# Patient Record
Sex: Female | Born: 1955 | Race: White | Hispanic: No | Smoking: Former smoker
Health system: Southern US, Community
[De-identification: ages and names within clinical notes are randomized; demographics above are authoritative.]

## PROBLEM LIST (undated history)

## (undated) DIAGNOSIS — J302 Other seasonal allergic rhinitis: Secondary | ICD-10-CM

## (undated) DIAGNOSIS — F32A Depression, unspecified: Secondary | ICD-10-CM

## (undated) DIAGNOSIS — F329 Major depressive disorder, single episode, unspecified: Secondary | ICD-10-CM

## (undated) DIAGNOSIS — E7800 Pure hypercholesterolemia, unspecified: Secondary | ICD-10-CM

## (undated) DIAGNOSIS — F419 Anxiety disorder, unspecified: Secondary | ICD-10-CM

## (undated) HISTORY — PX: OTHER SURGICAL HISTORY: SHX169

---

## 1999-11-30 ENCOUNTER — Emergency Department (HOSPITAL_COMMUNITY): Admission: EM | Admit: 1999-11-30 | Discharge: 1999-11-30 | Payer: Self-pay | Admitting: Emergency Medicine

## 1999-11-30 ENCOUNTER — Encounter: Payer: Self-pay | Admitting: Emergency Medicine

## 1999-12-02 ENCOUNTER — Emergency Department (HOSPITAL_COMMUNITY): Admission: EM | Admit: 1999-12-02 | Discharge: 1999-12-02 | Payer: Self-pay | Admitting: Emergency Medicine

## 1999-12-10 ENCOUNTER — Emergency Department (HOSPITAL_COMMUNITY): Admission: EM | Admit: 1999-12-10 | Discharge: 1999-12-10 | Payer: Self-pay | Admitting: Emergency Medicine

## 2004-11-28 ENCOUNTER — Ambulatory Visit: Payer: Self-pay | Admitting: Gastroenterology

## 2005-12-23 ENCOUNTER — Ambulatory Visit: Payer: Self-pay | Admitting: Unknown Physician Specialty

## 2005-12-25 ENCOUNTER — Ambulatory Visit: Payer: Self-pay | Admitting: Unknown Physician Specialty

## 2008-07-25 ENCOUNTER — Ambulatory Visit: Payer: Self-pay | Admitting: Unknown Physician Specialty

## 2009-09-24 ENCOUNTER — Ambulatory Visit: Payer: Self-pay | Admitting: Unknown Physician Specialty

## 2011-04-10 ENCOUNTER — Ambulatory Visit: Payer: Self-pay | Admitting: Unknown Physician Specialty

## 2012-06-02 ENCOUNTER — Ambulatory Visit: Payer: Self-pay | Admitting: Physician Assistant

## 2013-08-22 ENCOUNTER — Ambulatory Visit: Payer: Self-pay | Admitting: Physician Assistant

## 2015-01-26 ENCOUNTER — Other Ambulatory Visit: Payer: Self-pay | Admitting: Internal Medicine

## 2015-01-26 DIAGNOSIS — Z1231 Encounter for screening mammogram for malignant neoplasm of breast: Secondary | ICD-10-CM

## 2015-01-30 ENCOUNTER — Ambulatory Visit: Payer: Self-pay

## 2015-02-19 ENCOUNTER — Ambulatory Visit: Payer: Self-pay

## 2015-03-06 ENCOUNTER — Ambulatory Visit
Admission: RE | Admit: 2015-03-06 | Discharge: 2015-03-06 | Disposition: A | Discharge status: Home / Self Care | Payer: 59 | Source: Ambulatory Visit | Attending: Internal Medicine | Admitting: Internal Medicine

## 2015-03-06 DIAGNOSIS — Z1231 Encounter for screening mammogram for malignant neoplasm of breast: Secondary | ICD-10-CM | POA: Diagnosis present

## 2015-04-27 ENCOUNTER — Encounter: Payer: Self-pay | Admitting: *Deleted

## 2015-04-30 ENCOUNTER — Ambulatory Visit: Payer: 59 | Admitting: Anesthesiology

## 2015-04-30 ENCOUNTER — Encounter
Admission: RE | Disposition: A | Discharge status: Home / Self Care | Payer: Self-pay | Source: Ambulatory Visit | Attending: Gastroenterology

## 2015-04-30 ENCOUNTER — Ambulatory Visit
Admission: RE | Admit: 2015-04-30 | Discharge: 2015-04-30 | Disposition: A | Discharge status: Home / Self Care | Payer: 59 | Source: Ambulatory Visit | Attending: Gastroenterology | Admitting: Gastroenterology

## 2015-04-30 ENCOUNTER — Encounter: Payer: Self-pay | Admitting: *Deleted

## 2015-04-30 DIAGNOSIS — F329 Major depressive disorder, single episode, unspecified: Secondary | ICD-10-CM | POA: Diagnosis not present

## 2015-04-30 DIAGNOSIS — E780 Pure hypercholesterolemia: Secondary | ICD-10-CM | POA: Insufficient documentation

## 2015-04-30 DIAGNOSIS — Z87891 Personal history of nicotine dependence: Secondary | ICD-10-CM | POA: Insufficient documentation

## 2015-04-30 DIAGNOSIS — Z91030 Bee allergy status: Secondary | ICD-10-CM | POA: Insufficient documentation

## 2015-04-30 DIAGNOSIS — Z1211 Encounter for screening for malignant neoplasm of colon: Secondary | ICD-10-CM | POA: Insufficient documentation

## 2015-04-30 DIAGNOSIS — F419 Anxiety disorder, unspecified: Secondary | ICD-10-CM | POA: Diagnosis not present

## 2015-04-30 HISTORY — PX: COLONOSCOPY WITH PROPOFOL: SHX5780

## 2015-04-30 HISTORY — DX: Other seasonal allergic rhinitis: J30.2

## 2015-04-30 HISTORY — DX: Major depressive disorder, single episode, unspecified: F32.9

## 2015-04-30 HISTORY — DX: Depression, unspecified: F32.A

## 2015-04-30 HISTORY — DX: Pure hypercholesterolemia, unspecified: E78.00

## 2015-04-30 HISTORY — DX: Anxiety disorder, unspecified: F41.9

## 2015-04-30 SURGERY — COLONOSCOPY WITH PROPOFOL
Anesthesia: General

## 2015-04-30 MED ORDER — PROPOFOL INFUSION 10 MG/ML OPTIME
INTRAVENOUS | Status: DC | PRN
  Administered 2015-04-30: 120 ug/kg/min via INTRAVENOUS

## 2015-04-30 MED ORDER — SODIUM CHLORIDE 0.9 % IV SOLN: INTRAVENOUS | Status: DC

## 2015-04-30 MED ORDER — PROPOFOL 10 MG/ML IV BOLUS
INTRAVENOUS | Status: DC | PRN
  Administered 2015-04-30 (×2): 50 mg via INTRAVENOUS

## 2015-04-30 MED ORDER — SODIUM CHLORIDE 0.9 % IV SOLN
INTRAVENOUS | Status: DC
  Administered 2015-04-30: 08:00:00 via INTRAVENOUS

## 2015-04-30 MED ORDER — LIDOCAINE HCL (CARDIAC) 20 MG/ML IV SOLN
INTRAVENOUS | Status: DC | PRN
  Administered 2015-04-30: 60 mg via INTRAVENOUS

## 2015-04-30 NOTE — Transfer of Care (Signed)
Immediate Anesthesi  a Transfer of Care Note  Patient: Jean Mercado  Procedure(s) Performed: Procedure(s): COLONOSCOPY WITH PROPOFOL (N/A)  Patient Location: PACU  Anesthesia Type:General  Level of Consciousness: awake  Airway & Oxygen Therapy: Patient Spontanous Breathing  Post-op Assessment: Report given to RN and Post -op Vital signs reviewed and stable  Post vital signs: Reviewed and stable  Last Vitals:  Filed Vitals:   04/30/15 0827  BP: 100/53  Pulse: 60  Temp: 36 C  Resp: 17    Complications: No apparent anesthesia complications

## 2015-04-30 NOTE — H&P (Signed)
Primary Care Physician:  Lynnea Ferrier, MD Primary Gastroenterologist:  Dr. Bluford Kaufmann  Pre-Procedure History & Physical: HPI:  Jean Mercado is a 59 y.o. female is here for an colonoscopy.   Past Medical History  Diagnosis Date  . Depression   . Anxiety   . High cholesterol   . Seasonal allergies     Past Surgical History  Procedure Laterality Date  . Dilation and crettage      Prior to Admission medications   Medication Sig Start Date End Date Taking? Authorizing Provider  acetaminophen (TYLENOL) 325 MG tablet Take 325 mg by mouth every 4 (four) hours as needed.   Yes Historical Provider, MD  Calcium Ascorbate 500 MG TABS Take 500 mg by mouth every morning.   Yes Historical Provider, MD  calcium citrate (CALCITRATE - DOSED IN MG ELEMENTAL CALCIUM) 950 MG tablet Take 200 mg of elemental calcium by mouth daily.   Yes Historical Provider, MD  cetirizine (ZYRTEC) 10 MG tablet Take 10 mg by mouth daily.   Yes Historical Provider, MD  EPINEPHrine 0.3 mg/0.3 mL IJ SOAJ injection Inject 0.3 mg into the muscle once.   Yes Historical Provider, MD  FLUoxetine (PROZAC) 20 MG capsule Take 60 mg by mouth daily.   Yes Historical Provider, MD  fluticasone (FLONASE) 50 MCG/ACT nasal spray Place 2 sprays into both nostrils daily.   Yes Historical Provider, MD  Lactobacillus Rhamnosus, GG, ([employer] PROBIOTIC, LACTOBACILLUS, PO) Take 1 capsule by mouth every morning.   Yes Historical Provider, MD  Multiple Vitamin (MULTIVITAMIN WITH MINERALS) TABS tablet Take 1 tablet by mouth daily.   Yes Historical Provider, MD  simvastatin (ZOCOR) 40 MG tablet Take 40 mg by mouth daily.   Yes Historical Provider, MD  vitamin E 400 UNIT capsule Take 400 Units by mouth daily.   Yes Historical Provider, MD    Allergies as of 03/13/2015  . (Not on File)    History reviewed. No pertinent family history.  Social History   Social History  . Marital Status: Married    Spouse Name: N/A  . Number of  Children: N/A  . Years of Education: N/A   Occupational History  . Not on file.   Social History Main Topics  . Smoking status: Former Games developer  . Smokeless tobacco: Never Used  . Alcohol Use: 4.2 oz/week    7 Glasses of wine per week  . Drug Use: No  . Sexual Activity: Not on file   Other Topics Concern  . Not on file   Social History Narrative    Review of Systems: See HPI, otherwise negative ROS  Physical Exam: BP 105/70 mmHg  Pulse 68  Temp(Src) 98.2 F (36.8 C) (Tympanic)  Resp 18  Ht  (1.778 m)  Wt 74.844 kg (165 lb)  BMI 23.68 kg/m2  SpO2 99% General:   Alert,  pleasant and cooperative in NAD Head:  Normocephalic and atraumatic. Neck:  Supple; no masses or thyromegaly. Lungs:  Clear throughout to auscultation.    Heart:  Regular rate and rhythm. Abdomen:  Soft, nontender and nondistended. Normal bowel sounds, without guarding, and without rebound.   Neurologic:  Alert and  oriented x4;  grossly normal neurologically.  Impression/Plan: Jean Mercado is here for an colonoscopy to be performed for screening.  Risks, benefits, limitations, and alternatives regarding colonoscopy have been reviewed with the patient.  Questions have been answered.  All parties agreeable.   OH, Jean Standing, MD  04/30/2015, 7:59 AM

## 2015-04-30 NOTE — Anesthesia Preprocedure Evaluation (Signed)
Anesthesia Evaluation  Patient identified by MRN, date of birth, ID band Patient awake    Reviewed: Allergy & Precautions, H&P , NPO status , Patient's Chart, lab work & pertinent test results, reviewed documented beta blocker date and time   History of Anesthesia Complications Negative for: history of anesthetic complications  Airway Mallampati: II  TM Distance: >3 FB Neck ROM: full    Dental no notable dental hx. (+) Caps   Pulmonary neg pulmonary ROS, former smoker,    Pulmonary exam normal breath sounds clear to auscultation       Cardiovascular Exercise Tolerance: Good negative cardio ROS Normal cardiovascular exam Rhythm:regular Rate:Normal     Neuro/Psych PSYCHIATRIC DISORDERS (depression) negative neurological ROS     GI/Hepatic negative GI ROS, Neg liver ROS,   Endo/Other  negative endocrine ROS  Renal/GU negative Renal ROS  negative genitourinary   Musculoskeletal   Abdominal   Peds  Hematology negative hematology ROS (+)   Anesthesia Other Findings Past Medical History:   Depression                                                   Anxiety                                                      High cholesterol                                             Seasonal allergies                                           Reproductive/Obstetrics negative OB ROS                             Anesthesia Physical Anesthesia Plan  ASA: II  Anesthesia Plan: General   Post-op Pain Management:    Induction:   Airway Management Planned:   Additional Equipment:   Intra-op Plan:   Post-operative Plan:   Informed Consent: I have reviewed the patients History and Physical, chart, labs and discussed the procedure including the risks, benefits and alternatives for the proposed anesthesia with the patient or authorized representative who has indicated his/her understanding and acceptance.    Dental Advisory Given  Plan Discussed with: Anesthesiologist, CRNA and Surgeon  Anesthesia Plan Comments:         Anesthesia Quick Evaluation

## 2015-04-30 NOTE — Op Note (Signed)
[facility] Gastroenterology Patient Name: Jean Mercado Procedure Date: 04/30/2015 7:35 AM MRN: [id number] Account #: [id number] Date of Birth: [birth year] Admit Type: Outpatient Age: 59 Room: [facility] ENDO ROOM 4 Gender: Female Note Status: Finalized Procedure:         Colonoscopy Indications:       Screening for colorectal malignant neoplasm Providers:         Ezzard Standing. Bluford Kaufmann, MD Referring MD:      Sallye Lat Md, MD (Referring MD) Medicines:         Monitored Anesthesia Care Complications:     No immediate complications. Procedure:         Pre-Anesthesia Assessment:                    - Prior to the procedure, a History and Physical was                     performed, and patient medications, allergies and                     sensitivities were reviewed. The patient's tolerance of                     previous anesthesia was reviewed.                    - The risks and benefits of the procedure and the sedation                     options and risks were discussed with the patient. All                     questions were answered and informed consent was obtained.                    - After reviewing the risks and benefits, the patient was                     deemed in satisfactory condition to undergo the procedure.                    After obtaining informed consent, the colonoscope was                     passed under direct vision. Throughout the procedure, the                     patient's blood pressure, pulse, and oxygen saturations                     were monitored continuously. The Colonoscope was                     introduced through the anus and advanced to the the cecum,                     identified by appendiceal orifice and ileocecal valve. The                     colonoscopy was performed without difficulty. The patient                     tolerated the procedure well. The quality of the bowel  preparation was good. Findings:      The  colon (entire examined portion) appeared normal. Impression:        - The entire examined colon is normal.                    - No specimens collected. Recommendation:    - Discharge patient to home.                    - Repeat colonoscopy in 10 years for surveillance.                    - The findings and recommendations were discussed with the                     patient. Procedure Code(s): --- Professional ---                    [phone], Colonoscopy, flexible; diagnostic, including                     collection of specimen(s) by brushing or washing, when                     performed (separate procedure) Diagnosis Code(s): --- Professional ---                    Z12.11, Encounter for screening for malignant neoplasm of                     colon CPT copyright 2014 American Medical Association. All rights reserved. The codes documented in this report are preliminary and upon coder review may  be revised to meet current compliance requirements. Wallace Cullens, MD 04/30/2015 8:22:30 AM This report has been signed electronically. Number of Addenda: 0 Note Initiated On: 04/30/2015 7:35 AM Scope Withdrawal Time: 0 hours 6 minutes 18 seconds  Total Procedure Duration: 0 hours 12 minutes 40 seconds       [facility]

## 2015-05-01 NOTE — Anesthesia Postprocedure Evaluation (Signed)
  Anesthesia Post-op Note  Patient: Jean Mercado  Procedure(s) Performed: Procedure(s): COLONOSCOPY WITH PROPOFOL (N/A)  Anesthesia type:General  Patient location: PACU  Post pain: Pain level controlled  Post assessment: Post-op Vital signs reviewed, Patient's Cardiovascular Status Stable, Respiratory Function Stable, Patent Airway and No signs of Nausea or vomiting  Post vital signs: Reviewed and stable  Last Vitals:  Filed Vitals:   04/30/15 0855  BP: 103/69  Pulse: 78  Temp:   Resp: 25    Level of consciousness: awake, alert  and patient cooperative  Complications: No apparent anesthesia complications

## 2015-05-02 ENCOUNTER — Encounter: Payer: Self-pay | Admitting: Gastroenterology

## 2016-01-28 ENCOUNTER — Other Ambulatory Visit: Payer: Self-pay | Admitting: Internal Medicine

## 2016-01-28 DIAGNOSIS — Z1231 Encounter for screening mammogram for malignant neoplasm of breast: Secondary | ICD-10-CM

## 2016-03-06 ENCOUNTER — Ambulatory Visit: Payer: Self-pay

## 2016-03-24 ENCOUNTER — Ambulatory Visit
Admission: RE | Admit: 2016-03-24 | Discharge: 2016-03-24 | Disposition: A | Discharge status: Home / Self Care | Payer: BLUE CROSS/BLUE SHIELD | Source: Ambulatory Visit | Attending: Internal Medicine | Admitting: Internal Medicine

## 2016-03-24 ENCOUNTER — Other Ambulatory Visit: Payer: Self-pay | Admitting: Internal Medicine

## 2016-03-24 DIAGNOSIS — Z1231 Encounter for screening mammogram for malignant neoplasm of breast: Secondary | ICD-10-CM | POA: Insufficient documentation

## 2017-01-28 ENCOUNTER — Other Ambulatory Visit: Payer: Self-pay | Admitting: Internal Medicine

## 2017-01-28 DIAGNOSIS — Z1231 Encounter for screening mammogram for malignant neoplasm of breast: Secondary | ICD-10-CM

## 2017-03-25 ENCOUNTER — Ambulatory Visit: Payer: Self-pay | Attending: Oncology

## 2017-03-25 ENCOUNTER — Ambulatory Visit
Admission: RE | Admit: 2017-03-25 | Discharge: 2017-03-25 | Disposition: A | Discharge status: Home / Self Care | Payer: Self-pay | Source: Ambulatory Visit | Attending: Oncology | Admitting: Oncology

## 2017-03-25 VITALS — BP 119/82 | HR 78 | Temp 98.7°F | Ht 69.0 in | Wt 163.0 lb

## 2017-03-25 DIAGNOSIS — Z0000 Encounter for general adult medical examination without abnormal findings: Secondary | ICD-10-CM

## 2017-03-25 NOTE — Progress Notes (Signed)
Subjective:     Patient ID: Jean CoupeSandra Dameron Mercado, female   DOB: [birth year], 61 y.o.   MRN: [id number]  HPI   Review of Systems     Objective:   Physical Exam  Pulmonary/Chest: Right breast exhibits no inverted nipple, no mass, no nipple discharge, no skin change and no tenderness. Left breast exhibits no inverted nipple, no mass, no nipple discharge, no skin change and no tenderness. Breasts are symmetrical.  Red "bug bites" under left breast  Genitourinary: No labial fusion. There is no rash, tenderness, lesion or injury on the right labia. There is no rash, tenderness, lesion or injury on the left labia. Uterus is not deviated, not enlarged, not fixed and not tender. Cervix exhibits no motion tenderness, no discharge and no friability. Right adnexum displays no mass, no tenderness and no fullness. Left adnexum displays no mass, no tenderness and no fullness. No erythema, tenderness or bleeding in the vagina. No foreign body in the vagina. No signs of injury around the vagina. No vaginal discharge found.       Assessment:     61 year old patient presents for [facility] clinic visit.  Patient screened, and meets [facility] eligibility.  Patient does not have insurance, Medicare or Medicaid.  Handout given on Affordable Care Act.  Instructed patient on breast self-exam using teach back method.  CBE unremarkable.  No mass or lump palpated.  Pelvic exam normal. Patient has no children by birth, but married a man with 4 daughters.     Plan:     Sent for bilateral screening mammogram.  Specimen collected for pap.

## 2017-03-30 LAB — PAP LB AND HPV HIGH-RISK
HPV, HIGH-RISK: NEGATIVE
PAP Smear Comment: 0

## 2017-03-31 NOTE — Progress Notes (Signed)
Phoned patient with [facility] 1 mammogram results, and negative/negative pap results.  Next pap is due in 5 years.  Copy to [facility].  Faxed results to Dr. Graciela Husbands.

## 2019-07-13 DIAGNOSIS — M25551 Pain in right hip: Secondary | ICD-10-CM | POA: Diagnosis not present

## 2019-07-13 DIAGNOSIS — M542 Cervicalgia: Secondary | ICD-10-CM | POA: Diagnosis not present

## 2019-07-13 DIAGNOSIS — M546 Pain in thoracic spine: Secondary | ICD-10-CM | POA: Diagnosis not present

## 2019-07-13 DIAGNOSIS — M545 Low back pain: Secondary | ICD-10-CM | POA: Diagnosis not present

## 2019-08-16 DIAGNOSIS — M546 Pain in thoracic spine: Secondary | ICD-10-CM | POA: Diagnosis not present

## 2019-08-16 DIAGNOSIS — M545 Low back pain: Secondary | ICD-10-CM | POA: Diagnosis not present

## 2019-08-16 DIAGNOSIS — M25551 Pain in right hip: Secondary | ICD-10-CM | POA: Diagnosis not present

## 2019-08-16 DIAGNOSIS — M542 Cervicalgia: Secondary | ICD-10-CM | POA: Diagnosis not present

## 2019-08-22 DIAGNOSIS — E7800 Pure hypercholesterolemia, unspecified: Secondary | ICD-10-CM | POA: Diagnosis not present

## 2019-10-31 DIAGNOSIS — M542 Cervicalgia: Secondary | ICD-10-CM | POA: Diagnosis not present

## 2019-10-31 DIAGNOSIS — M545 Low back pain: Secondary | ICD-10-CM | POA: Diagnosis not present

## 2019-10-31 DIAGNOSIS — M546 Pain in thoracic spine: Secondary | ICD-10-CM | POA: Diagnosis not present

## 2019-10-31 DIAGNOSIS — M25551 Pain in right hip: Secondary | ICD-10-CM | POA: Diagnosis not present

## 2020-01-25 DIAGNOSIS — E7800 Pure hypercholesterolemia, unspecified: Secondary | ICD-10-CM | POA: Diagnosis not present

## 2020-01-25 DIAGNOSIS — Z0000 Encounter for general adult medical examination without abnormal findings: Secondary | ICD-10-CM | POA: Diagnosis not present

## 2020-01-30 DIAGNOSIS — M9903 Segmental and somatic dysfunction of lumbar region: Secondary | ICD-10-CM | POA: Diagnosis not present

## 2020-01-30 DIAGNOSIS — M25551 Pain in right hip: Secondary | ICD-10-CM | POA: Diagnosis not present

## 2020-01-30 DIAGNOSIS — M9904 Segmental and somatic dysfunction of sacral region: Secondary | ICD-10-CM | POA: Diagnosis not present

## 2020-01-30 DIAGNOSIS — M545 Low back pain: Secondary | ICD-10-CM | POA: Diagnosis not present

## 2020-02-01 DIAGNOSIS — M8588 Other specified disorders of bone density and structure, other site: Secondary | ICD-10-CM | POA: Diagnosis not present

## 2020-02-01 DIAGNOSIS — Z0000 Encounter for general adult medical examination without abnormal findings: Secondary | ICD-10-CM | POA: Diagnosis not present

## 2020-02-01 DIAGNOSIS — R202 Paresthesia of skin: Secondary | ICD-10-CM | POA: Diagnosis not present

## 2020-02-01 DIAGNOSIS — Z780 Asymptomatic menopausal state: Secondary | ICD-10-CM | POA: Diagnosis not present

## 2020-02-01 DIAGNOSIS — F325 Major depressive disorder, single episode, in full remission: Secondary | ICD-10-CM | POA: Diagnosis not present

## 2020-02-01 DIAGNOSIS — J3089 Other allergic rhinitis: Secondary | ICD-10-CM | POA: Diagnosis not present

## 2020-02-01 DIAGNOSIS — R200 Anesthesia of skin: Secondary | ICD-10-CM | POA: Diagnosis not present

## 2020-02-01 DIAGNOSIS — E7800 Pure hypercholesterolemia, unspecified: Secondary | ICD-10-CM | POA: Diagnosis not present

## 2020-04-25 DIAGNOSIS — M9904 Segmental and somatic dysfunction of sacral region: Secondary | ICD-10-CM | POA: Diagnosis not present

## 2020-04-25 DIAGNOSIS — M25551 Pain in right hip: Secondary | ICD-10-CM | POA: Diagnosis not present

## 2020-04-25 DIAGNOSIS — M545 Low back pain: Secondary | ICD-10-CM | POA: Diagnosis not present

## 2020-04-25 DIAGNOSIS — M9903 Segmental and somatic dysfunction of lumbar region: Secondary | ICD-10-CM | POA: Diagnosis not present

## 2020-06-22 ENCOUNTER — Ambulatory Visit: Payer: Self-pay | Attending: Internal Medicine

## 2020-06-22 ENCOUNTER — Other Ambulatory Visit: Payer: Self-pay | Admitting: Internal Medicine

## 2020-06-22 DIAGNOSIS — Z23 Encounter for immunization: Secondary | ICD-10-CM

## 2020-06-22 NOTE — Progress Notes (Signed)
   Covid-19 Vaccination Clinic  Name:  Jean Mercado    MRN: [id number] DOB: 07-21-1956  06/22/2020  [name] was observed post Covid-19 immunization for 30 minutes based on pre-vaccination screening without incident. She was provided with Vaccine Information Sheet and instruction to access the V-Safe system.   [name] was instructed to call 911 with any severe reactions post vaccine: Marland Kitchen Difficulty breathing  . Swelling of face and throat  . A fast heartbeat  . A bad rash all over body  . Dizziness and weakness

## 2021-01-04 DIAGNOSIS — H903 Sensorineural hearing loss, bilateral: Secondary | ICD-10-CM | POA: Diagnosis not present

## 2021-01-25 DIAGNOSIS — Z0000 Encounter for general adult medical examination without abnormal findings: Secondary | ICD-10-CM | POA: Diagnosis not present

## 2021-01-25 DIAGNOSIS — E7800 Pure hypercholesterolemia, unspecified: Secondary | ICD-10-CM | POA: Diagnosis not present

## 2021-02-01 DIAGNOSIS — Z0000 Encounter for general adult medical examination without abnormal findings: Secondary | ICD-10-CM | POA: Diagnosis not present

## 2021-02-01 DIAGNOSIS — E7800 Pure hypercholesterolemia, unspecified: Secondary | ICD-10-CM | POA: Diagnosis not present

## 2021-02-01 DIAGNOSIS — Z1231 Encounter for screening mammogram for malignant neoplasm of breast: Secondary | ICD-10-CM | POA: Diagnosis not present

## 2021-02-01 DIAGNOSIS — F325 Major depressive disorder, single episode, in full remission: Secondary | ICD-10-CM | POA: Diagnosis not present

## 2021-02-27 ENCOUNTER — Other Ambulatory Visit: Payer: Self-pay | Admitting: Internal Medicine

## 2021-02-27 DIAGNOSIS — Z1231 Encounter for screening mammogram for malignant neoplasm of breast: Secondary | ICD-10-CM

## 2021-03-11 ENCOUNTER — Other Ambulatory Visit: Payer: Self-pay

## 2021-03-11 ENCOUNTER — Ambulatory Visit
Admission: RE | Admit: 2021-03-11 | Discharge: 2021-03-11 | Disposition: A | Discharge status: Home / Self Care | Payer: PPO | Source: Ambulatory Visit | Attending: Internal Medicine | Admitting: Internal Medicine

## 2021-03-11 DIAGNOSIS — Z1231 Encounter for screening mammogram for malignant neoplasm of breast: Secondary | ICD-10-CM

## 2021-04-23 DIAGNOSIS — M9901 Segmental and somatic dysfunction of cervical region: Secondary | ICD-10-CM | POA: Diagnosis not present

## 2021-04-23 DIAGNOSIS — M7918 Myalgia, other site: Secondary | ICD-10-CM | POA: Diagnosis not present

## 2021-04-23 DIAGNOSIS — M5413 Radiculopathy, cervicothoracic region: Secondary | ICD-10-CM | POA: Diagnosis not present

## 2021-04-23 DIAGNOSIS — M25511 Pain in right shoulder: Secondary | ICD-10-CM | POA: Diagnosis not present

## 2021-04-26 DIAGNOSIS — M25511 Pain in right shoulder: Secondary | ICD-10-CM | POA: Diagnosis not present

## 2021-04-26 DIAGNOSIS — M9901 Segmental and somatic dysfunction of cervical region: Secondary | ICD-10-CM | POA: Diagnosis not present

## 2021-04-26 DIAGNOSIS — M5413 Radiculopathy, cervicothoracic region: Secondary | ICD-10-CM | POA: Diagnosis not present

## 2021-04-26 DIAGNOSIS — M7918 Myalgia, other site: Secondary | ICD-10-CM | POA: Diagnosis not present

## 2021-10-30 DIAGNOSIS — E7800 Pure hypercholesterolemia, unspecified: Secondary | ICD-10-CM | POA: Diagnosis not present

## 2022-01-27 DIAGNOSIS — Z0000 Encounter for general adult medical examination without abnormal findings: Secondary | ICD-10-CM | POA: Diagnosis not present

## 2022-01-27 DIAGNOSIS — E7800 Pure hypercholesterolemia, unspecified: Secondary | ICD-10-CM | POA: Diagnosis not present

## 2022-02-03 DIAGNOSIS — Z23 Encounter for immunization: Secondary | ICD-10-CM | POA: Diagnosis not present

## 2022-02-03 DIAGNOSIS — J3089 Other allergic rhinitis: Secondary | ICD-10-CM | POA: Diagnosis not present

## 2022-02-03 DIAGNOSIS — E7800 Pure hypercholesterolemia, unspecified: Secondary | ICD-10-CM | POA: Diagnosis not present

## 2022-02-03 DIAGNOSIS — F325 Major depressive disorder, single episode, in full remission: Secondary | ICD-10-CM | POA: Diagnosis not present

## 2022-02-03 DIAGNOSIS — Z0000 Encounter for general adult medical examination without abnormal findings: Secondary | ICD-10-CM | POA: Diagnosis not present

## 2022-02-27 ENCOUNTER — Other Ambulatory Visit: Payer: Self-pay | Admitting: Internal Medicine

## 2022-02-27 DIAGNOSIS — Z1231 Encounter for screening mammogram for malignant neoplasm of breast: Secondary | ICD-10-CM

## 2022-03-26 ENCOUNTER — Ambulatory Visit
Admission: RE | Admit: 2022-03-26 | Discharge: 2022-03-26 | Disposition: A | Discharge status: Home / Self Care | Payer: PPO | Source: Ambulatory Visit | Attending: Internal Medicine | Admitting: Internal Medicine

## 2022-03-26 DIAGNOSIS — Z1231 Encounter for screening mammogram for malignant neoplasm of breast: Secondary | ICD-10-CM | POA: Insufficient documentation

## 2022-12-19 DIAGNOSIS — J019 Acute sinusitis, unspecified: Secondary | ICD-10-CM | POA: Diagnosis not present

## 2022-12-19 DIAGNOSIS — J3489 Other specified disorders of nose and nasal sinuses: Secondary | ICD-10-CM | POA: Diagnosis not present

## 2022-12-19 DIAGNOSIS — R058 Other specified cough: Secondary | ICD-10-CM | POA: Diagnosis not present

## 2022-12-19 DIAGNOSIS — R0981 Nasal congestion: Secondary | ICD-10-CM | POA: Diagnosis not present

## 2023-02-20 DIAGNOSIS — E7800 Pure hypercholesterolemia, unspecified: Secondary | ICD-10-CM | POA: Diagnosis not present

## 2023-02-20 DIAGNOSIS — Z0000 Encounter for general adult medical examination without abnormal findings: Secondary | ICD-10-CM | POA: Diagnosis not present

## 2023-02-27 DIAGNOSIS — E7800 Pure hypercholesterolemia, unspecified: Secondary | ICD-10-CM | POA: Diagnosis not present

## 2023-02-27 DIAGNOSIS — F325 Major depressive disorder, single episode, in full remission: Secondary | ICD-10-CM | POA: Diagnosis not present

## 2023-02-27 DIAGNOSIS — Z0000 Encounter for general adult medical examination without abnormal findings: Secondary | ICD-10-CM | POA: Diagnosis not present

## 2023-03-24 DIAGNOSIS — T7840XA Allergy, unspecified, initial encounter: Secondary | ICD-10-CM | POA: Diagnosis not present

## 2023-03-24 DIAGNOSIS — R21 Rash and other nonspecific skin eruption: Secondary | ICD-10-CM | POA: Diagnosis not present

## 2023-04-08 DIAGNOSIS — M5413 Radiculopathy, cervicothoracic region: Secondary | ICD-10-CM | POA: Diagnosis not present

## 2023-04-08 DIAGNOSIS — M25511 Pain in right shoulder: Secondary | ICD-10-CM | POA: Diagnosis not present

## 2023-04-08 DIAGNOSIS — M7918 Myalgia, other site: Secondary | ICD-10-CM | POA: Diagnosis not present

## 2023-04-08 DIAGNOSIS — M9901 Segmental and somatic dysfunction of cervical region: Secondary | ICD-10-CM | POA: Diagnosis not present

## 2023-04-22 ENCOUNTER — Other Ambulatory Visit: Payer: Self-pay | Admitting: Internal Medicine

## 2023-04-22 DIAGNOSIS — Z1231 Encounter for screening mammogram for malignant neoplasm of breast: Secondary | ICD-10-CM

## 2023-04-23 ENCOUNTER — Ambulatory Visit
Admission: RE | Admit: 2023-04-23 | Discharge: 2023-04-23 | Disposition: A | Discharge status: Home / Self Care | Payer: PPO | Source: Ambulatory Visit | Attending: Internal Medicine | Admitting: Internal Medicine

## 2023-04-23 DIAGNOSIS — H2513 Age-related nuclear cataract, bilateral: Secondary | ICD-10-CM | POA: Diagnosis not present

## 2023-04-23 DIAGNOSIS — Z1231 Encounter for screening mammogram for malignant neoplasm of breast: Secondary | ICD-10-CM | POA: Insufficient documentation

## 2023-05-07 DIAGNOSIS — D225 Melanocytic nevi of trunk: Secondary | ICD-10-CM | POA: Diagnosis not present

## 2023-05-07 DIAGNOSIS — D485 Neoplasm of uncertain behavior of skin: Secondary | ICD-10-CM | POA: Diagnosis not present

## 2023-05-07 DIAGNOSIS — C44212 Basal cell carcinoma of skin of right ear and external auricular canal: Secondary | ICD-10-CM | POA: Diagnosis not present

## 2023-05-07 DIAGNOSIS — D2271 Melanocytic nevi of right lower limb, including hip: Secondary | ICD-10-CM | POA: Diagnosis not present

## 2023-05-07 DIAGNOSIS — D2262 Melanocytic nevi of left upper limb, including shoulder: Secondary | ICD-10-CM | POA: Diagnosis not present

## 2023-05-07 DIAGNOSIS — C44519 Basal cell carcinoma of skin of other part of trunk: Secondary | ICD-10-CM | POA: Diagnosis not present

## 2023-05-07 DIAGNOSIS — D2272 Melanocytic nevi of left lower limb, including hip: Secondary | ICD-10-CM | POA: Diagnosis not present

## 2023-05-07 DIAGNOSIS — X32XXXA Exposure to sunlight, initial encounter: Secondary | ICD-10-CM | POA: Diagnosis not present

## 2023-05-07 DIAGNOSIS — C44319 Basal cell carcinoma of skin of other parts of face: Secondary | ICD-10-CM | POA: Diagnosis not present

## 2023-05-07 DIAGNOSIS — L570 Actinic keratosis: Secondary | ICD-10-CM | POA: Diagnosis not present

## 2023-05-07 DIAGNOSIS — L821 Other seborrheic keratosis: Secondary | ICD-10-CM | POA: Diagnosis not present

## 2023-05-07 DIAGNOSIS — D2261 Melanocytic nevi of right upper limb, including shoulder: Secondary | ICD-10-CM | POA: Diagnosis not present

## 2023-05-07 DIAGNOSIS — L820 Inflamed seborrheic keratosis: Secondary | ICD-10-CM | POA: Diagnosis not present

## 2023-05-07 DIAGNOSIS — L538 Other specified erythematous conditions: Secondary | ICD-10-CM | POA: Diagnosis not present

## 2023-05-13 IMAGING — MG MM DIGITAL SCREENING BILAT W/ TOMO AND CAD
8 of 14 series · 8 of 40 positions shown · non-contrast
Comparison: Previous exam(s).

CLINICAL DATA: Screening.

EXAM:
DIGITAL SCREENING BILATERAL MAMMOGRAM WITH TOMOSYNTHESIS AND CAD
TECHNIQUE: Bilateral screening digital craniocaudal and mediolateral oblique
mammograms were obtained. Bilateral screening digital breast
tomosynthesis was performed. The images were evaluated with
computer-aided detection.

[L MLO synth-2D (1 of 2)]
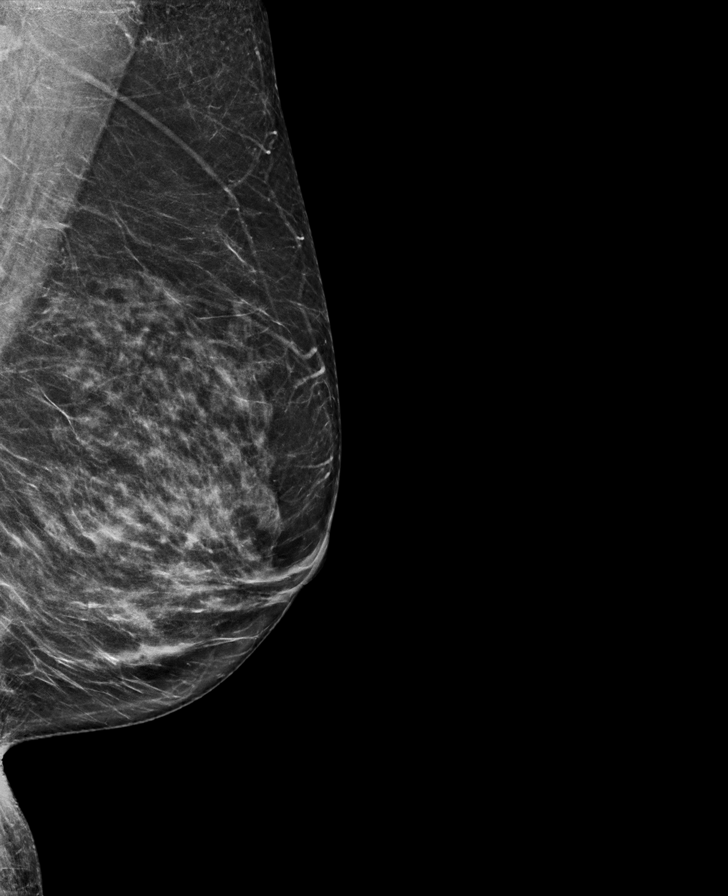

[R MLO synth-2D (1 of 2)]
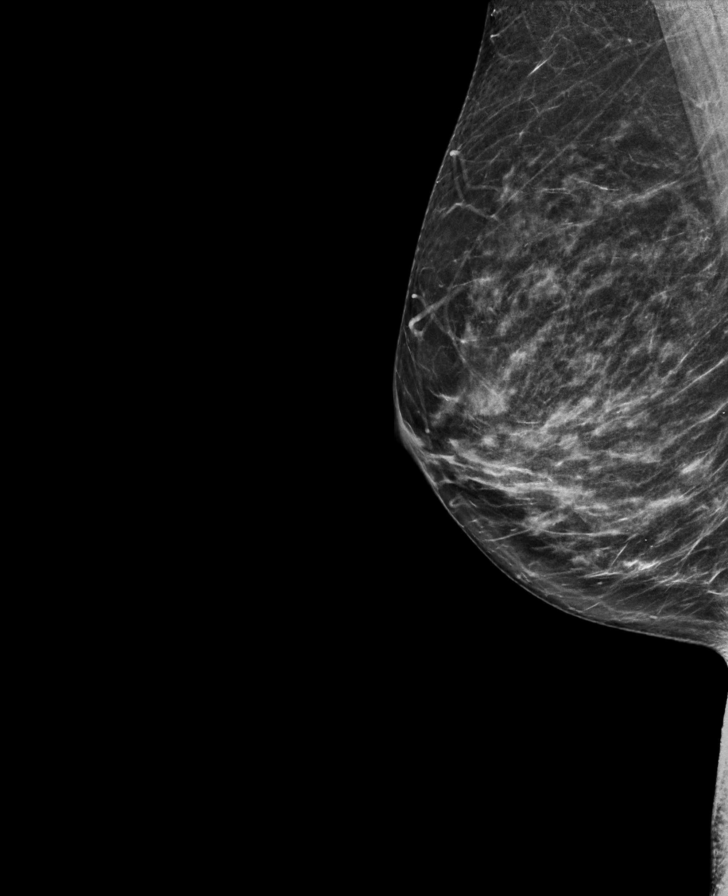

[L MLO synth-2D (2 of 2)]
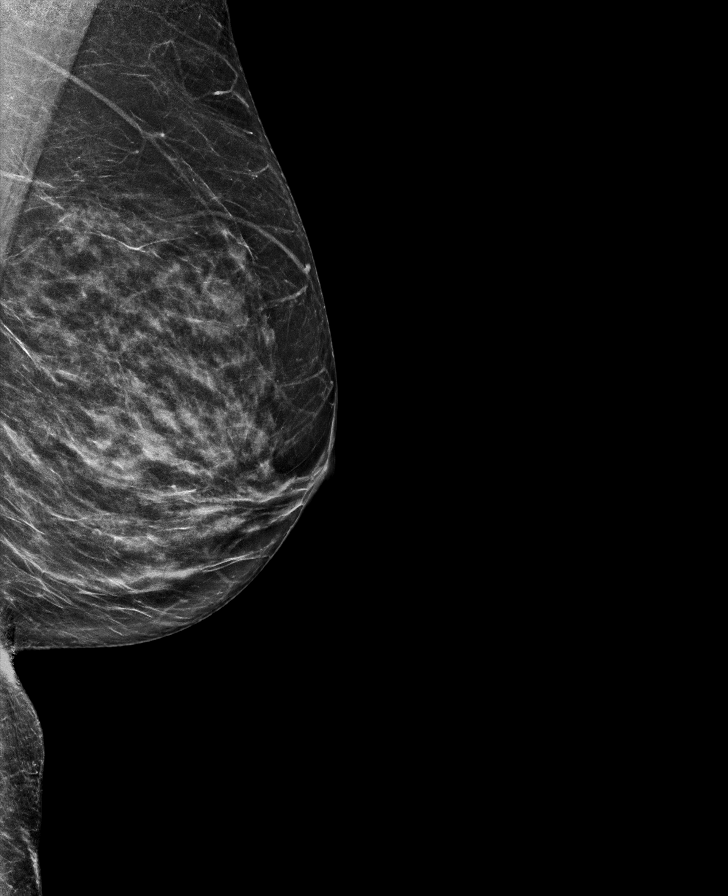

[R MLO synth-2D (2 of 2)]
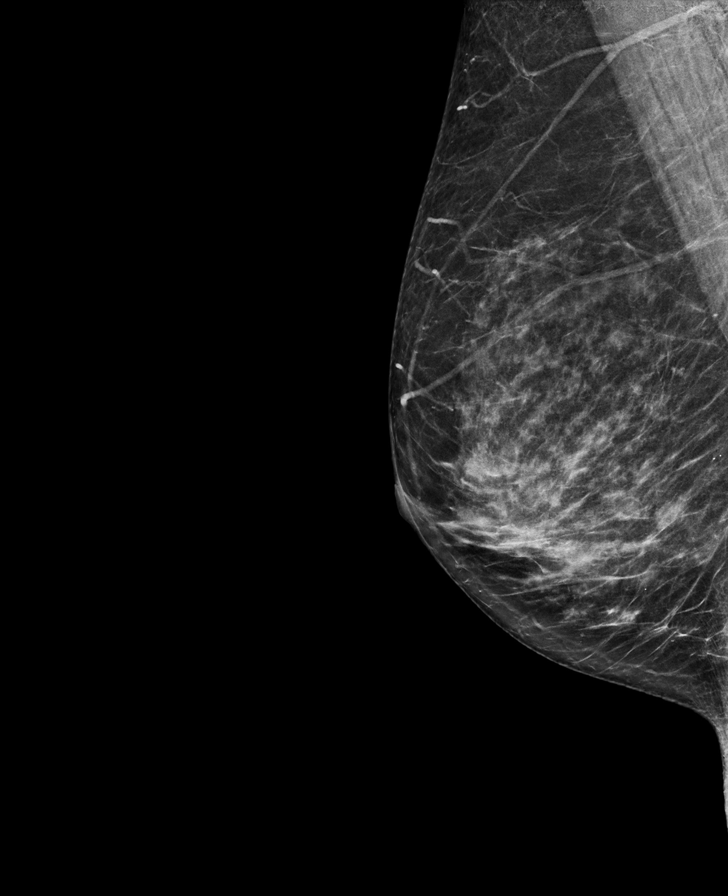

[L CC synth-2D (1 of 2)]
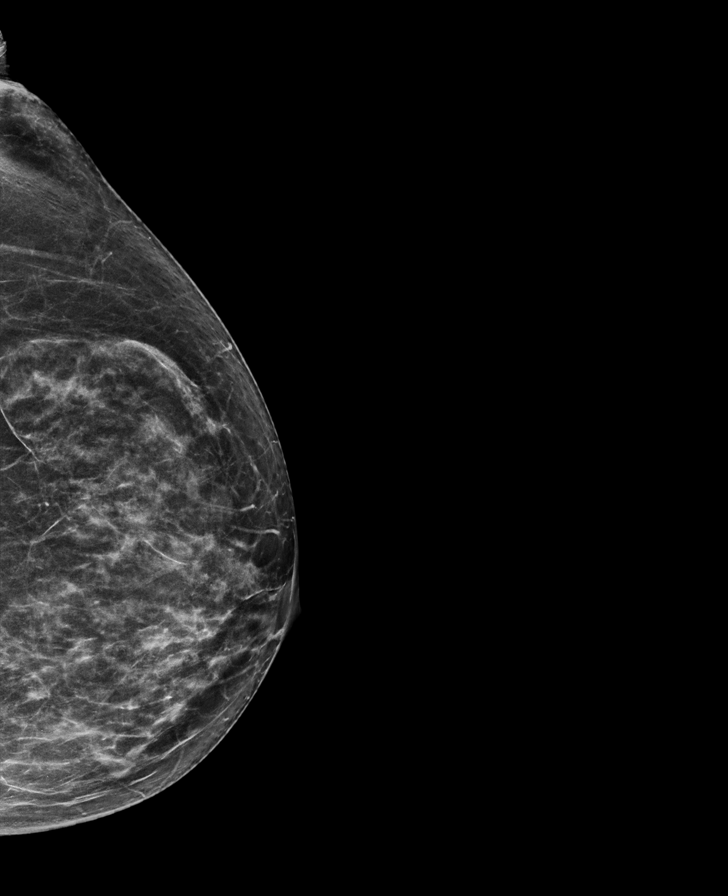

[L CC synth-2D (2 of 2)]
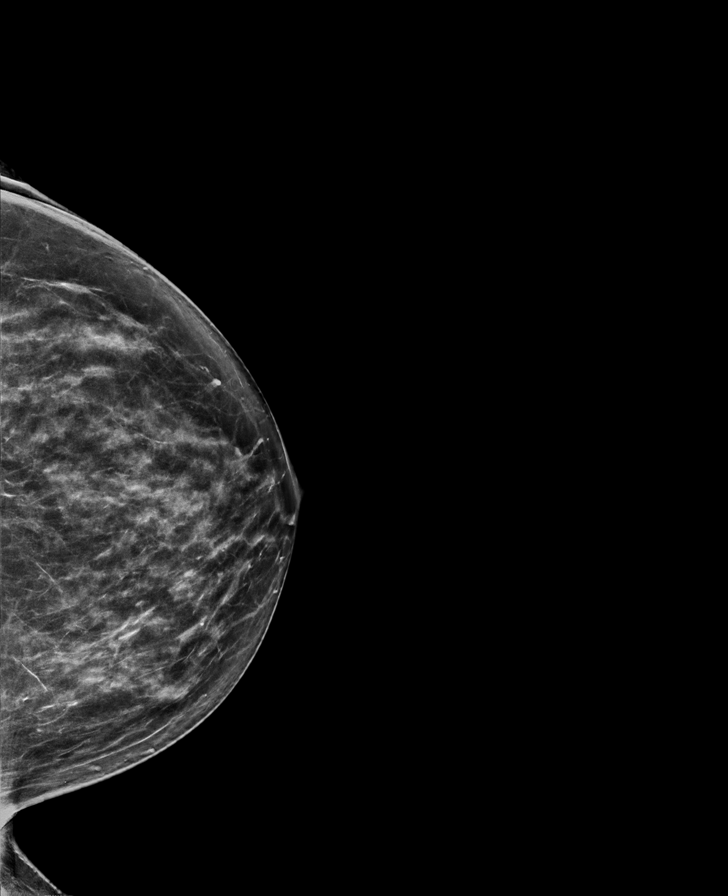

[R CC synth-2D]
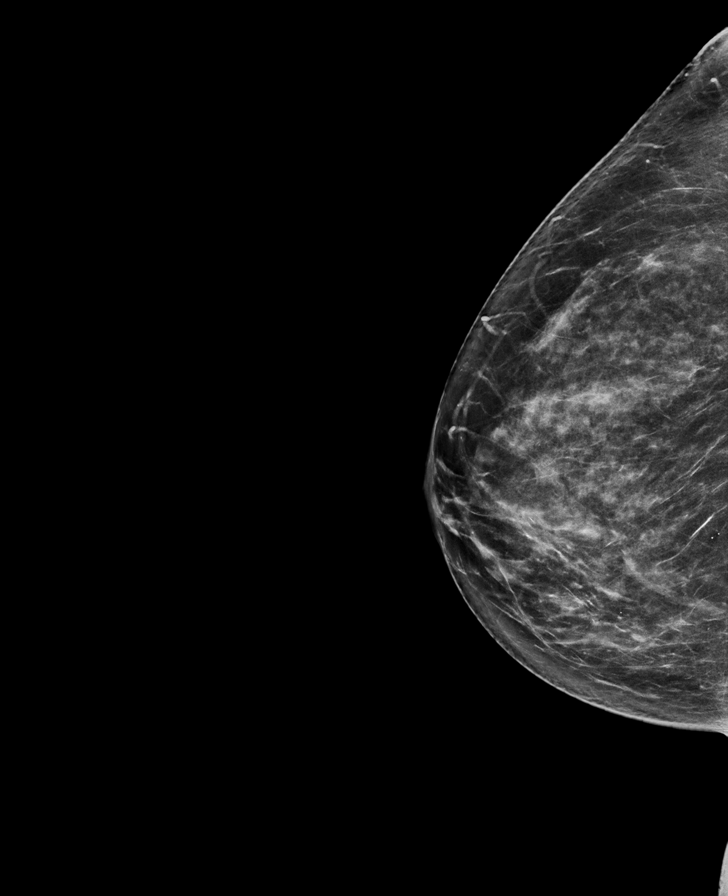

[R MLO tomo · tomo slice 33/66.0]
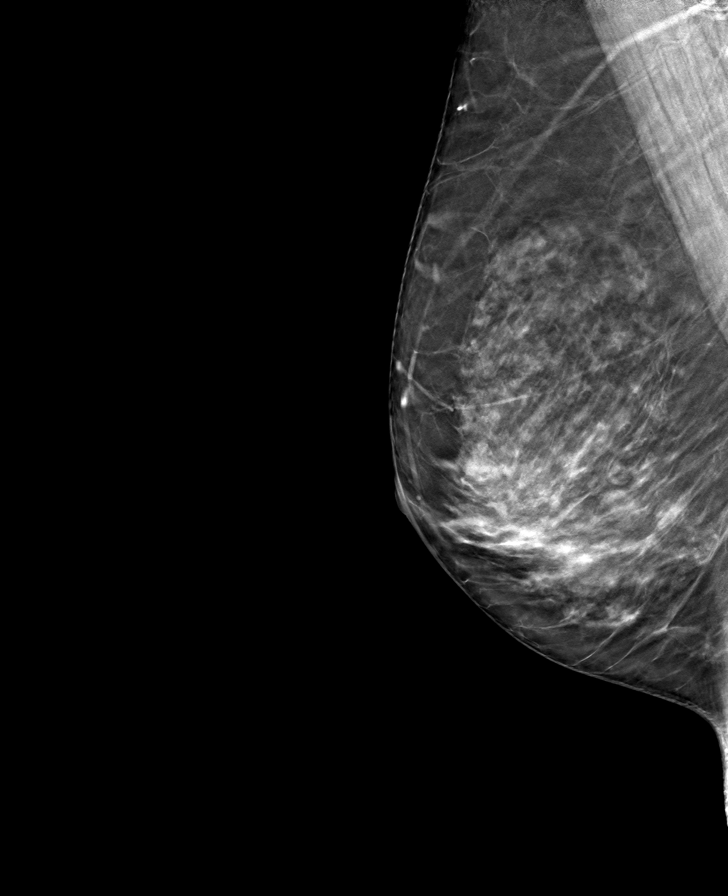

[8 of 40 positions shown; findings below may reference images not displayed]

ACR Breast Density Category c: The breast tissue is heterogeneously
dense, which may obscure small masses.
FINDINGS: There are no findings suspicious for malignancy.
IMPRESSION: No mammographic evidence of malignancy. A result letter of this
screening mammogram will be mailed directly to the patient.

RECOMMENDATION:
Screening mammogram in one year. (Code:Q3-W-BC3)

BI-RADS CATEGORY  1: Negative.

## 2023-05-14 DIAGNOSIS — C44519 Basal cell carcinoma of skin of other part of trunk: Secondary | ICD-10-CM | POA: Diagnosis not present

## 2023-05-26 DIAGNOSIS — C44319 Basal cell carcinoma of skin of other parts of face: Secondary | ICD-10-CM | POA: Diagnosis not present

## 2023-06-04 ENCOUNTER — Other Ambulatory Visit: Payer: Self-pay | Admitting: Medical Genetics

## 2023-06-04 DIAGNOSIS — Z006 Encounter for examination for normal comparison and control in clinical research program: Secondary | ICD-10-CM

## 2023-07-27 DIAGNOSIS — L578 Other skin changes due to chronic exposure to nonionizing radiation: Secondary | ICD-10-CM | POA: Diagnosis not present

## 2023-07-27 DIAGNOSIS — L814 Other melanin hyperpigmentation: Secondary | ICD-10-CM | POA: Diagnosis not present

## 2023-07-27 DIAGNOSIS — L988 Other specified disorders of the skin and subcutaneous tissue: Secondary | ICD-10-CM | POA: Diagnosis not present

## 2023-07-27 DIAGNOSIS — C44212 Basal cell carcinoma of skin of right ear and external auricular canal: Secondary | ICD-10-CM | POA: Diagnosis not present

## 2023-10-28 DIAGNOSIS — J019 Acute sinusitis, unspecified: Secondary | ICD-10-CM | POA: Diagnosis not present

## 2023-11-04 DIAGNOSIS — L570 Actinic keratosis: Secondary | ICD-10-CM | POA: Diagnosis not present

## 2023-11-04 DIAGNOSIS — Z85828 Personal history of other malignant neoplasm of skin: Secondary | ICD-10-CM | POA: Diagnosis not present

## 2023-11-04 DIAGNOSIS — C44519 Basal cell carcinoma of skin of other part of trunk: Secondary | ICD-10-CM | POA: Diagnosis not present

## 2023-11-04 DIAGNOSIS — D485 Neoplasm of uncertain behavior of skin: Secondary | ICD-10-CM | POA: Diagnosis not present

## 2023-11-04 DIAGNOSIS — C44712 Basal cell carcinoma of skin of right lower limb, including hip: Secondary | ICD-10-CM | POA: Diagnosis not present

## 2023-11-04 DIAGNOSIS — L820 Inflamed seborrheic keratosis: Secondary | ICD-10-CM | POA: Diagnosis not present

## 2023-11-04 DIAGNOSIS — D2272 Melanocytic nevi of left lower limb, including hip: Secondary | ICD-10-CM | POA: Diagnosis not present

## 2023-11-04 DIAGNOSIS — D225 Melanocytic nevi of trunk: Secondary | ICD-10-CM | POA: Diagnosis not present

## 2023-11-04 DIAGNOSIS — D2261 Melanocytic nevi of right upper limb, including shoulder: Secondary | ICD-10-CM | POA: Diagnosis not present

## 2023-11-04 DIAGNOSIS — D2262 Melanocytic nevi of left upper limb, including shoulder: Secondary | ICD-10-CM | POA: Diagnosis not present

## 2023-11-04 DIAGNOSIS — B078 Other viral warts: Secondary | ICD-10-CM | POA: Diagnosis not present

## 2023-11-04 DIAGNOSIS — R238 Other skin changes: Secondary | ICD-10-CM | POA: Diagnosis not present

## 2023-11-11 DIAGNOSIS — J019 Acute sinusitis, unspecified: Secondary | ICD-10-CM | POA: Diagnosis not present

## 2023-12-23 DIAGNOSIS — C44712 Basal cell carcinoma of skin of right lower limb, including hip: Secondary | ICD-10-CM | POA: Diagnosis not present

## 2023-12-23 DIAGNOSIS — R238 Other skin changes: Secondary | ICD-10-CM | POA: Diagnosis not present

## 2023-12-23 DIAGNOSIS — B078 Other viral warts: Secondary | ICD-10-CM | POA: Diagnosis not present

## 2023-12-23 DIAGNOSIS — L2989 Other pruritus: Secondary | ICD-10-CM | POA: Diagnosis not present

## 2023-12-23 DIAGNOSIS — C44519 Basal cell carcinoma of skin of other part of trunk: Secondary | ICD-10-CM | POA: Diagnosis not present

## 2023-12-23 DIAGNOSIS — L820 Inflamed seborrheic keratosis: Secondary | ICD-10-CM | POA: Diagnosis not present

## 2023-12-23 DIAGNOSIS — L570 Actinic keratosis: Secondary | ICD-10-CM | POA: Diagnosis not present

## 2023-12-23 DIAGNOSIS — L538 Other specified erythematous conditions: Secondary | ICD-10-CM | POA: Diagnosis not present

## 2024-04-07 DIAGNOSIS — Z0000 Encounter for general adult medical examination without abnormal findings: Secondary | ICD-10-CM | POA: Diagnosis not present

## 2024-04-07 DIAGNOSIS — E7800 Pure hypercholesterolemia, unspecified: Secondary | ICD-10-CM | POA: Diagnosis not present

## 2024-04-13 DIAGNOSIS — R200 Anesthesia of skin: Secondary | ICD-10-CM | POA: Diagnosis not present

## 2024-04-13 DIAGNOSIS — Z0000 Encounter for general adult medical examination without abnormal findings: Secondary | ICD-10-CM | POA: Diagnosis not present

## 2024-04-13 DIAGNOSIS — F325 Major depressive disorder, single episode, in full remission: Secondary | ICD-10-CM | POA: Diagnosis not present

## 2024-04-13 DIAGNOSIS — Z131 Encounter for screening for diabetes mellitus: Secondary | ICD-10-CM | POA: Diagnosis not present

## 2024-04-13 DIAGNOSIS — R202 Paresthesia of skin: Secondary | ICD-10-CM | POA: Diagnosis not present

## 2024-04-13 DIAGNOSIS — Z1331 Encounter for screening for depression: Secondary | ICD-10-CM | POA: Diagnosis not present

## 2024-04-13 DIAGNOSIS — J3089 Other allergic rhinitis: Secondary | ICD-10-CM | POA: Diagnosis not present

## 2024-04-13 DIAGNOSIS — E7800 Pure hypercholesterolemia, unspecified: Secondary | ICD-10-CM | POA: Diagnosis not present

## 2024-04-15 ENCOUNTER — Other Ambulatory Visit: Payer: Self-pay | Admitting: Internal Medicine

## 2024-04-15 DIAGNOSIS — E7800 Pure hypercholesterolemia, unspecified: Secondary | ICD-10-CM

## 2024-04-19 ENCOUNTER — Ambulatory Visit
Admission: RE | Admit: 2024-04-19 | Discharge: 2024-04-19 | Disposition: A | Discharge status: Home / Self Care | Payer: Self-pay | Source: Ambulatory Visit | Attending: Internal Medicine | Admitting: Internal Medicine

## 2024-04-19 DIAGNOSIS — E7800 Pure hypercholesterolemia, unspecified: Secondary | ICD-10-CM | POA: Insufficient documentation

## 2024-04-26 ENCOUNTER — Other Ambulatory Visit

## 2024-04-27 ENCOUNTER — Other Ambulatory Visit
Admission: RE | Admit: 2024-04-27 | Discharge: 2024-04-27 | Disposition: A | Discharge status: Home / Self Care | Payer: Self-pay | Source: Ambulatory Visit | Attending: Medical Genetics | Admitting: Medical Genetics

## 2024-04-27 DIAGNOSIS — H43813 Vitreous degeneration, bilateral: Secondary | ICD-10-CM | POA: Diagnosis not present

## 2024-04-27 DIAGNOSIS — H2513 Age-related nuclear cataract, bilateral: Secondary | ICD-10-CM | POA: Diagnosis not present

## 2024-04-27 DIAGNOSIS — H47321 Drusen of optic disc, right eye: Secondary | ICD-10-CM | POA: Diagnosis not present

## 2024-04-27 DIAGNOSIS — Z006 Encounter for examination for normal comparison and control in clinical research program: Secondary | ICD-10-CM | POA: Insufficient documentation

## 2024-04-27 DIAGNOSIS — H5203 Hypermetropia, bilateral: Secondary | ICD-10-CM | POA: Diagnosis not present

## 2024-05-06 LAB — GENECONNECT MOLECULAR SCREEN: Genetic Analysis Overall Interpretation: NEGATIVE

## 2024-05-11 DIAGNOSIS — D225 Melanocytic nevi of trunk: Secondary | ICD-10-CM | POA: Diagnosis not present

## 2024-05-11 DIAGNOSIS — L309 Dermatitis, unspecified: Secondary | ICD-10-CM | POA: Diagnosis not present

## 2024-05-11 DIAGNOSIS — R208 Other disturbances of skin sensation: Secondary | ICD-10-CM | POA: Diagnosis not present

## 2024-05-11 DIAGNOSIS — L570 Actinic keratosis: Secondary | ICD-10-CM | POA: Diagnosis not present

## 2024-05-11 DIAGNOSIS — D2262 Melanocytic nevi of left upper limb, including shoulder: Secondary | ICD-10-CM | POA: Diagnosis not present

## 2024-05-11 DIAGNOSIS — D2261 Melanocytic nevi of right upper limb, including shoulder: Secondary | ICD-10-CM | POA: Diagnosis not present

## 2024-05-11 DIAGNOSIS — D485 Neoplasm of uncertain behavior of skin: Secondary | ICD-10-CM | POA: Diagnosis not present

## 2024-05-11 DIAGNOSIS — L820 Inflamed seborrheic keratosis: Secondary | ICD-10-CM | POA: Diagnosis not present

## 2024-05-11 DIAGNOSIS — C44529 Squamous cell carcinoma of skin of other part of trunk: Secondary | ICD-10-CM | POA: Diagnosis not present

## 2024-05-11 DIAGNOSIS — L538 Other specified erythematous conditions: Secondary | ICD-10-CM | POA: Diagnosis not present

## 2024-05-11 DIAGNOSIS — D2272 Melanocytic nevi of left lower limb, including hip: Secondary | ICD-10-CM | POA: Diagnosis not present

## 2024-05-11 DIAGNOSIS — Z85828 Personal history of other malignant neoplasm of skin: Secondary | ICD-10-CM | POA: Diagnosis not present

## 2024-05-11 DIAGNOSIS — L308 Other specified dermatitis: Secondary | ICD-10-CM | POA: Diagnosis not present

## 2024-05-11 DIAGNOSIS — B078 Other viral warts: Secondary | ICD-10-CM | POA: Diagnosis not present

## 2024-07-13 DIAGNOSIS — C44529 Squamous cell carcinoma of skin of other part of trunk: Secondary | ICD-10-CM | POA: Diagnosis not present
